# Patient Record
Sex: Female | Born: 1995 | Race: White | Hispanic: No | Marital: Single | Smoking: Never smoker
Health system: Southern US, Community
[De-identification: ages and names within clinical notes are randomized; demographics above are authoritative.]

## PROBLEM LIST (undated history)

## (undated) HISTORY — PX: TONSILLECTOMY: SUR1361

## (undated) HISTORY — PX: ADENOIDECTOMY: SUR15

---
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
## 2016-09-27 ENCOUNTER — Emergency Department (HOSPITAL_COMMUNITY): Payer: Federal, State, Local not specified - PPO

## 2016-09-27 ENCOUNTER — Encounter (HOSPITAL_COMMUNITY): Payer: Self-pay

## 2016-09-27 ENCOUNTER — Emergency Department (HOSPITAL_COMMUNITY)
Admission: EM | Admit: 2016-09-27 | Discharge: 2016-09-27 | Disposition: A | Discharge status: Home / Self Care | Payer: Federal, State, Local not specified - PPO | Attending: Emergency Medicine | Admitting: Emergency Medicine

## 2016-09-27 DIAGNOSIS — Y999 Unspecified external cause status: Secondary | ICD-10-CM | POA: Insufficient documentation

## 2016-09-27 DIAGNOSIS — S6992XA Unspecified injury of left wrist, hand and finger(s), initial encounter: Secondary | ICD-10-CM | POA: Diagnosis present

## 2016-09-27 DIAGNOSIS — Y929 Unspecified place or not applicable: Secondary | ICD-10-CM | POA: Insufficient documentation

## 2016-09-27 DIAGNOSIS — S63502A Unspecified sprain of left wrist, initial encounter: Secondary | ICD-10-CM | POA: Diagnosis not present

## 2016-09-27 DIAGNOSIS — Y939 Activity, unspecified: Secondary | ICD-10-CM | POA: Insufficient documentation

## 2016-09-27 MED ORDER — IBUPROFEN 600 MG PO TABS: 600.0000 mg | ORAL_TABLET | Freq: Four times a day (QID) | ORAL | 0 refills | Status: DC | PRN

## 2016-09-27 MED ORDER — IBUPROFEN 200 MG PO TABS: 400.0000 mg | ORAL_TABLET | ORAL | 0 refills | Status: AC | PRN

## 2016-09-27 MED ORDER — IBUPROFEN 600 MG PO TABS: 600.0000 mg | ORAL_TABLET | Freq: Four times a day (QID) | ORAL | 0 refills | Status: AC | PRN

## 2016-09-27 NOTE — Discharge Instructions (Signed)
Follow up with your Physicain if symptoms persist past one week

## 2016-09-27 NOTE — ED Triage Notes (Signed)
Reports of falling off horse pta. Swelling noted to left wrist.

## 2016-09-27 NOTE — ED Provider Notes (Signed)
[facility]-EMER[facility]ENCY DEPT Provider Note   CSN: [id number] Arrival date & time: 09/27/16  1731     History   Chief Complaint Chief Complaint  Patient presents with  . Wrist Pain    HPI Marissa Esparza is a 21 y.o. female.  The history is provided by the patient. No language interpreter was used.  Wrist Pain  This is a new problem. The current episode started less than 1 hour ago. The problem occurs constantly. The problem has not changed since onset.Pertinent negatives include no headaches. Nothing aggravates the symptoms. Nothing relieves the symptoms. She has tried nothing for the symptoms. The treatment provided no relief.  Pt reports she fell off of a bucking horse and landed injuring left wrist.    Past Medical History:  Diagnosis Date  . Migraine     Patient Active Problem List   Diagnosis Date Noted  . ANKLE INSTABILITY 06/28/2008  . ANKLE SPRAIN, LEFT 06/28/2008    Past Surgical History:  Procedure Laterality Date  . ADENOIDECTOMY    . TONSILLECTOMY      OB History    No data available       Home Medications    Prior to Admission medications   Medication Sig Start Date End Date Taking? Authorizing Provider  ibuprofen (ADVIL,MOTRIN) 200 MG tablet Take 2 tablets (400 mg total) by mouth as needed. For pain 09/27/16   Elson Areas, PA-C  ibuprofen (ADVIL,MOTRIN) 600 MG tablet Take 1 tablet (600 mg total) by mouth every 6 (six) hours as needed. 09/27/16   Elson Areas, PA-C  Norgestimate-Ethinyl Estradiol Triphasic (TRI-SPRINTEC) 0.18/0.215/0.25 MG-35 MCG tablet Take 1 tablet by mouth at bedtime.      [provider]  sulfamethoxazole-trimethoprim (BACTRIM,SEPTRA) 400-80 MG per tablet Take 1 tablet by mouth 2 (two) times daily.      [provider]    Family History No family history on file.  Social History Social History  Substance Use Topics  . Smoking status: Never Smoker  . Smokeless tobacco: Never Used  . Alcohol use No      Allergies   Patient has no known allergies.   Review of Systems Review of Systems  Neurological: Negative for headaches.  All other systems reviewed and are negative.    Physical Exam Updated Vital Signs BP (!) 125/94 (BP Location: Right Arm)   Pulse 99   Temp 98.2 F (36.8 C) (Oral)   Resp 18   Ht  (1.549 m)   Wt 47.7 kg (105 lb 3.2 oz)   LMP 09/22/2016   SpO2 100%   BMI 19.88 kg/m   Physical Exam  Constitutional: She appears well-developed and well-nourished.  HENT:  Head: Normocephalic.  Neck: Normal range of motion.  Musculoskeletal: She exhibits tenderness. She exhibits no deformity.  Tender left wrist,  Slight swelling,  Pain with range of motion,  nv and ns intact   Neurological: She is alert.  Skin: Skin is warm.  Psychiatric: She has a normal mood and affect.  Nursing note and vitals reviewed.    ED Treatments / Results  Labs (all labs ordered are listed, but only abnormal results are displayed) Labs Reviewed - No data to display  EKG  EKG Interpretation None       Radiology Dg Wrist Complete Left  Result Date: 09/27/2016 CLINICAL DATA:  Per ED notes: Reports of falling off horse pta. Swelling noted to left wrist.Pt stated she jumped off horse bc it started bucking; co  lt wrist pain with movement. Pt states NCOP. EXAM: LEFT WRIST - COMPLETE 3+ VIEW COMPARISON:  None. FINDINGS: There is no evidence of fracture or dislocation. There is no evidence of arthropathy or other focal bone abnormality. Soft tissues are unremarkable. IMPRESSION: Negative. Electronically Signed   By: Amie Portland M.D.   On: 09/27/2016 18:15    Procedures Procedures (including critical care time)  Medications Ordered in ED Medications - No data to display   Initial Impression / Assessment and Plan / ED Course  I have reviewed the triage vital signs and the nursing notes.  Pertinent labs & imaging results that were available during my care of the patient  were reviewed by me and considered in my medical decision making (see chart for details).     Xray negative,  Pt placed in a wrist splint.  Pt advised to follow up with primary care or urgent care if pain persist past one week.  Final Clinical Impressions(s) / ED Diagnoses   Final diagnoses:  Sprain of left wrist, initial encounter    New Prescriptions Current Discharge Medication List    An After Visit Summary was printed and given to the patient. No outpatient prescriptions have been marked as taking for the 09/27/16 encounter [facility] Encounter).   Meds ordered this encounter  Medications  . DISCONTD: ibuprofen (ADVIL,MOTRIN) 600 MG tablet    Sig: Take 1 tablet (600 mg total) by mouth every 6 (six) hours as needed.    Dispense:  30 tablet    Refill:  0    Order Specific Question:   Supervising Provider    Answer:   MILLER, BRIAN [[id number]]  . ibuprofen (ADVIL,MOTRIN) 200 MG tablet    Sig: Take 2 tablets (400 mg total) by mouth as needed. For pain    Dispense:  30 tablet    Refill:  0    Order Specific Question:   Supervising Provider    Answer:   MILLER, BRIAN [[id number]]  . ibuprofen (ADVIL,MOTRIN) 600 MG tablet    Sig: Take 1 tablet (600 mg total) by mouth every 6 (six) hours as needed.    Dispense:  30 tablet    Refill:  0    Order Specific Question:   Supervising Provider    Answer:   Eber Hong [[id number]]     Elson Areas, PA-C 09/27/16 1842    Vanetta Mulders, MD 09/30/16 [phone]

## 2018-01-28 ENCOUNTER — Emergency Department (HOSPITAL_COMMUNITY): Payer: Federal, State, Local not specified - PPO

## 2018-01-28 ENCOUNTER — Emergency Department (HOSPITAL_COMMUNITY)
Admission: EM | Admit: 2018-01-28 | Discharge: 2018-01-28 | Disposition: A | Discharge status: Home / Self Care | Payer: Federal, State, Local not specified - PPO | Attending: Emergency Medicine | Admitting: Emergency Medicine

## 2018-01-28 DIAGNOSIS — N200 Calculus of kidney: Secondary | ICD-10-CM | POA: Diagnosis not present

## 2018-01-28 DIAGNOSIS — R110 Nausea: Secondary | ICD-10-CM | POA: Insufficient documentation

## 2018-01-28 DIAGNOSIS — R109 Unspecified abdominal pain: Secondary | ICD-10-CM | POA: Diagnosis present

## 2018-01-28 DIAGNOSIS — Z79899 Other long term (current) drug therapy: Secondary | ICD-10-CM | POA: Diagnosis not present

## 2018-01-28 LAB — COMPREHENSIVE METABOLIC PANEL
ALT: 14 U/L (ref 0–44)
AST: 36 U/L (ref 15–41)
Albumin: 4.5 g/dL (ref 3.5–5.0)
Alkaline Phosphatase: 43 U/L (ref 38–126)
Anion gap: 13 (ref 5–15)
BILIRUBIN TOTAL: 1.2 mg/dL (ref 0.3–1.2)
BUN: 5 mg/dL — ABNORMAL LOW (ref 6–20)
CO2: 19 mmol/L — ABNORMAL LOW (ref 22–32)
Calcium: 9.5 mg/dL (ref 8.9–10.3)
Chloride: 102 mmol/L (ref 98–111)
Creatinine, Ser: 0.8 mg/dL (ref 0.44–1.00)
GFR calc Af Amer: >60 mL/min (ref >60)
GFR calc non Af Amer: >60 mL/min (ref >60)
GLUCOSE: 111 mg/dL — AB (ref 70–99)
Potassium: 4.5 mmol/L (ref 3.5–5.1)
Sodium: 134 mmol/L — ABNORMAL LOW (ref 135–145)
Total Protein: 7.2 g/dL (ref 6.5–8.1)

## 2018-01-28 LAB — URINALYSIS, ROUTINE W REFLEX MICROSCOPIC
Bilirubin Urine: NEGATIVE
Glucose, UA: NEGATIVE mg/dL
Ketones, ur: 80 mg/dL — AB
Leukocytes, UA: NEGATIVE
Nitrite: NEGATIVE
Protein, ur: NEGATIVE mg/dL
Specific Gravity, Urine: 1.021 (ref 1.005–1.030)
pH: 5 (ref 5.0–8.0)

## 2018-01-28 LAB — CBC WITH DIFFERENTIAL/PLATELET
Abs Immature Granulocytes: 0.06 10*3/uL (ref 0.00–0.07)
Basophils Absolute: 0 10*3/uL (ref 0.0–0.1)
Basophils Relative: 0 %
Eosinophils Absolute: 0 10*3/uL (ref 0.0–0.5)
Eosinophils Relative: 0 %
HCT: 39.5 % (ref 36.0–46.0)
Hemoglobin: 12.9 g/dL (ref 12.0–15.0)
Immature Granulocytes: 0 %
Lymphocytes Relative: 7 %
Lymphs Abs: 1 10*3/uL (ref 0.7–4.0)
MCH: 31.2 pg (ref 26.0–34.0)
MCHC: 32.7 g/dL (ref 30.0–36.0)
MCV: 95.6 fL (ref 80.0–100.0)
MONOS PCT: 4 %
Monocytes Absolute: 0.6 10*3/uL (ref 0.1–1.0)
Neutro Abs: 12.8 10*3/uL — ABNORMAL HIGH (ref 1.7–7.7)
Neutrophils Relative %: 89 %
Platelets: 297 10*3/uL (ref 150–400)
RBC: 4.13 MIL/uL (ref 3.87–5.11)
RDW: 13 % (ref 11.5–15.5)
WBC: 14.4 10*3/uL — ABNORMAL HIGH (ref 4.0–10.5)
nRBC: 0 % (ref 0.0–0.2)

## 2018-01-28 LAB — I-STAT BETA HCG BLOOD, ED (MC, WL, AP ONLY): I-stat hCG, quantitative: <5 m[IU]/mL (ref <5)

## 2018-01-28 MED ORDER — HYDROCODONE-ACETAMINOPHEN 5-325 MG PO TABS: 1.0000 | ORAL_TABLET | Freq: Four times a day (QID) | ORAL | 0 refills | Status: AC | PRN

## 2018-01-28 MED ORDER — ONDANSETRON HCL 4 MG/2ML IJ SOLN
4.0000 mg | Freq: Once | INTRAMUSCULAR | Status: AC
  Administered 2018-01-28: 4 mg via INTRAVENOUS
  Filled 2018-01-28: qty 2

## 2018-01-28 MED ORDER — HYDROCODONE-ACETAMINOPHEN 5-325 MG PO TABS
1.0000 | ORAL_TABLET | Freq: Once | ORAL | Status: AC
  Administered 2018-01-28: 1 via ORAL
  Filled 2018-01-28: qty 1

## 2018-01-28 MED ORDER — SODIUM CHLORIDE 0.9 % IV BOLUS
500.0000 mL | Freq: Once | INTRAVENOUS | Status: AC
  Administered 2018-01-28: 500 mL via INTRAVENOUS

## 2018-01-28 MED ORDER — ONDANSETRON HCL 4 MG PO TABS: 4.0000 mg | ORAL_TABLET | Freq: Three times a day (TID) | ORAL | 0 refills | Status: AC | PRN

## 2018-01-28 MED ORDER — HYDROMORPHONE HCL 1 MG/ML IJ SOLN
0.5000 mg | Freq: Once | INTRAMUSCULAR | Status: AC
  Administered 2018-01-28: 0.5 mg via INTRAVENOUS
  Filled 2018-01-28: qty 1

## 2018-01-28 MED ORDER — TAMSULOSIN HCL 0.4 MG PO CAPS: 0.4000 mg | ORAL_CAPSULE | Freq: Every day | ORAL | 0 refills | Status: AC

## 2018-01-28 MED ORDER — MORPHINE SULFATE (PF) 4 MG/ML IV SOLN
4.0000 mg | Freq: Once | INTRAVENOUS | Status: AC
  Administered 2018-01-28: 4 mg via INTRAVENOUS
  Filled 2018-01-28: qty 1

## 2018-01-28 NOTE — ED Provider Notes (Signed)
[facility]   CSN: [id number] Arrival date & time: 01/28/18  1559     History   Chief Complaint Chief Complaint  Patient presents with  . Abdominal Pain    HPI Marissa Esparza is a 23 y.o. female for evaluation left-sided flank pain.  Patient states that the past 3 days, she has been having a discomfort in her bladder.  Today acutely at 2:00, she developed left-sided abdominal/flank pain.  It is sharp and severe and constant.  Movement and palpation makes it worse, nothing makes it better.  She has not taken anything for pain including Tylenol or ibuprofen.  She ports associated nausea, no vomiting.  She has fevers, chills, chest pain, shortness of breath, dysuria, urinary frequency, hematuria, or normal bowel movements.  She denies a history of kidney stones.  Patient states she has no medical problems, takes no medications daily.  She denies vaginal discharge or vaginal bleeding.  HPI  Past Medical History:  Diagnosis Date  . Migraine     Patient Active Problem List   Diagnosis Date Noted  . ANKLE INSTABILITY 06/28/2008  . ANKLE SPRAIN, LEFT 06/28/2008    Past Surgical History:  Procedure Laterality Date  . ADENOIDECTOMY    . TONSILLECTOMY       OB History   No obstetric history on file.      Home Medications    Prior to Admission medications   Medication Sig Start Date End Date Taking? Authorizing Provider  HYDROcodone-acetaminophen (NORCO/VICODIN) 5-325 MG tablet Take 1 tablet by mouth every 6 (six) hours as needed for severe pain. 01/28/18   Sopheap Boehle, PA-C  ibuprofen (ADVIL,MOTRIN) 200 MG tablet Take 2 tablets (400 mg total) by mouth as needed. For pain 09/27/16   Elson AreasSofia, Leslie K, PA-C  ibuprofen (ADVIL,MOTRIN) 600 MG tablet Take 1 tablet (600 mg total) by mouth every 6 (six) hours as needed. 09/27/16   Elson AreasSofia, Leslie K, PA-C  Norgestimate-Ethinyl Estradiol Triphasic (TRI-SPRINTEC) 0.18/0.215/0.25  MG-35 MCG tablet Take 1 tablet by mouth at bedtime.      [provider]  ondansetron (ZOFRAN) 4 MG tablet Take 1 tablet (4 mg total) by mouth every 8 (eight) hours as needed for nausea or vomiting. 01/28/18   Huong Luthi, PA-C  sulfamethoxazole-trimethoprim (BACTRIM,SEPTRA) 400-80 MG per tablet Take 1 tablet by mouth 2 (two) times daily.      [provider]  tamsulosin (FLOMAX) 0.4 MG CAPS capsule Take 1 capsule (0.4 mg total) by mouth daily. 01/28/18   Sina Lucchesi, PA-C    Family History No family history on file.  Social History Social History   Tobacco Use  . Smoking status: Never Smoker  . Smokeless tobacco: Never Used  Substance Use Topics  . Alcohol use: No  . Drug use: No     Allergies   Patient has no known allergies.   Review of Systems Review of Systems  Gastrointestinal: Positive for abdominal pain and nausea.  Genitourinary: Positive for flank pain.  All other systems reviewed and are negative.    Physical Exam Updated Vital Signs BP 111/71   Pulse 77   Temp 98.2 F (36.8 C) (Oral)   Resp 20   SpO2 100%   Physical Exam Vitals signs and nursing note reviewed.  Constitutional:      General: She is not in acute distress.    Appearance: She is well-developed.     Comments: Patient appears uncomfortable, but in no acute distress  HENT:     Head: Normocephalic and atraumatic.  Eyes:     Conjunctiva/sclera: Conjunctivae normal.     Pupils: Pupils are equal, round, and reactive to light.  Neck:     Musculoskeletal: Normal range of motion and neck supple.  Cardiovascular:     Rate and Rhythm: Normal rate and regular rhythm.     Pulses: Normal pulses.  Pulmonary:     Effort: Pulmonary effort is normal. No respiratory distress.     Breath sounds: Normal breath sounds. No wheezing.  Abdominal:     General: There is no distension.     Palpations: Abdomen is soft. There is no mass.     Tenderness: There is abdominal  tenderness. There is left CVA tenderness. There is no right CVA tenderness, guarding or rebound.     Comments: Tenderness to palpation of left upper and lower abdomen.  Tenderness palpation of left flank.  No tenderness palpation on the right side of the abdomen.  Negative rebound.  No rigidity, guarding, distention.  Musculoskeletal: Normal range of motion.  Skin:    General: Skin is warm and dry.     Capillary Refill: Capillary refill takes less than 2 seconds.  Neurological:     Mental Status: She is alert and oriented to person, place, and time.      ED Treatments / Results  Labs (all labs ordered are listed, but only abnormal results are displayed) Labs Reviewed  COMPREHENSIVE METABOLIC PANEL - Abnormal; Notable for the following components:      Result Value   Sodium 134 (*)    CO2 19 (*)    Glucose, Bld 111 (*)    BUN 5 (*)    All other components within normal limits  URINALYSIS, ROUTINE W REFLEX MICROSCOPIC - Abnormal; Notable for the following components:   APPearance HAZY (*)    Hgb urine dipstick MODERATE (*)    Ketones, ur 80 (*)    Bacteria, UA FEW (*)    All other components within normal limits  CBC WITH DIFFERENTIAL/PLATELET - Abnormal; Notable for the following components:   WBC 14.4 (*)    Neutro Abs 12.8 (*)    All other components within normal limits  URINE CULTURE  CBC WITH DIFFERENTIAL/PLATELET  I-STAT BETA HCG BLOOD, ED ([facility], [facility], [facility] ONLY)    EKG None  Radiology Ct Renal Stone Study  Result Date: 01/28/2018 CLINICAL DATA:  LEFT flank and lower back pain for 2 days, suspected stone disease EXAM: CT ABDOMEN AND PELVIS WITHOUT CONTRAST TECHNIQUE: Multidetector CT imaging of the abdomen and pelvis was performed following the standard protocol without IV contrast. Sagittal and coronal MPR images reconstructed from axial data set. Oral contrast was not administered for this indication. COMPARISON:  None FINDINGS: Lower chest: Lung bases clear  Hepatobiliary: Gallbladder and liver normal appearance Pancreas: Normal appearance Spleen: Normal appearance. Adrenals/Urinary Tract: Adrenal glands: Normal appearance. RIGHT kidney normal appearance. Mild LEFT hydronephrosis and hydroureter secondary to a 2 mm distal LEFT ureteral calculus image 64. Bladder decompressed. No renal masses. Stomach/Bowel: Normal appendix. Stomach and bowel loops grossly normal appearance for technique. Vascular/Lymphatic: Aorta normal caliber. Few pelvic phleboliths. No adenopathy. Reproductive: Diaphragm within vagina. Uterus and adnexa unremarkable. Other: Small amount of cul-de-sac free fluid, potentially physiologic in a young female. No free air. No hernia. Musculoskeletal: Unremarkable IMPRESSION: Mild LEFT hydronephrosis and hydroureter secondary to a 2 mm distal LEFT ureteral calculus. Electronically Signed   By: Ulyses SouthwardMark  Boles M.D.   On:  01/28/2018 17:41    Procedures Procedures (including critical care time)  Medications Ordered in ED Medications  sodium chloride 0.9 % bolus 500 mL (0 mLs Intravenous Stopped 01/28/18 1723)  ondansetron (ZOFRAN) injection 4 mg (4 mg Intravenous Given 01/28/18 1632)  morphine 4 MG/ML injection 4 mg (4 mg Intravenous Given 01/28/18 1632)  HYDROmorphone (DILAUDID) injection 0.5 mg (0.5 mg Intravenous Given 01/28/18 1717)  HYDROcodone-acetaminophen (NORCO/VICODIN) 5-325 MG per tablet 1 tablet (1 tablet Oral Given 01/28/18 1916)     Initial Impression / Assessment and Plan / ED Course  I have reviewed the triage vital signs and the nursing notes.  Pertinent labs & imaging results that were available during my care of the patient were reviewed by me and considered in my medical decision making (see chart for details).     Pt presenting for evaluation of left-sided abdominal pain.  Physical exam is patient is uncomfortable, but in no acute distress.  She is afebrile not tachycardic.  Appears nontoxic.  Pain is reproducible with  palpation of the flank, as well as the abdomen.  Likely kidney stone.  Also consider GI infection, pyelonephritis, or GU causes such as torsion versus TOA.  However, considering CVA tenderness, lower suspicion for GU causes at this time.  Will give fluids, Zofran, pain control, order urine, and CT renal.  On reassessment, patient reports mild improvement with morphine, but still having a lot of pain.  Will give small dose of Dilaudid.  Labs reassuring, creatinine stable.  Patient with leukocytosis, but no obvious infection.  No leuks or white cells, and only a few bacteria in the UA.  No urinary symptoms.  Doubt UTI, will send urine for culture.  CT renal shows left-sided kidney stone with mild hydro.  No obvious signs or concerns for torsion or TOA.  On reassessment, patient reports pain is much improved with Dilaudid.  Discussed symptomatic control at home, and follow-up with urology as needed.  PMP checked, patient without concerning narcotic use.  Strict return precautions given.  At this time, patient appears safe for discharge.  Patient states she understands and agrees to plan.   Final Clinical Impressions(s) / ED Diagnoses   Final diagnoses:  Kidney stone on left side    ED Discharge Orders         Ordered    ondansetron (ZOFRAN) 4 MG tablet  Every 8 hours PRN     01/28/18 1906    HYDROcodone-acetaminophen (NORCO/VICODIN) 5-325 MG tablet  Every 6 hours PRN     01/28/18 1906    tamsulosin (FLOMAX) 0.4 MG CAPS capsule  Daily     01/28/18 1906           Alveria Apley, Cordelia Poche 01/28/18 2009    Jacalyn Lefevre, MD 01/30/18 2313

## 2018-01-28 NOTE — Discharge Instructions (Addendum)
Make sure you are staying well-hydrated water.  This is very important.  Your urine should be clear to pale yellow. Use Tylenol or ibuprofen as needed for mild to moderate pain.  Use Norco as needed for severe breakthrough pain.  Have caution, this is a narcotic pain medicine.  Do not drive or operate machinery while taking this medicine. Flomax daily. Use Zofran as needed for nausea or vomiting. Use a urine strainer to try and catch the kidney stone. If your pain is not improving within the next week, follow-up with urology. Return to the emergency room if you develop high fevers, persistent vomiting, severe worsening pain, inability to urinate, or any new, worsening, or concerning symptoms.

## 2018-01-28 NOTE — ED Triage Notes (Signed)
[facility] evaluation of LLQ pain and nausea, hematuria for 1 week. Patient appears uncomfortable in triage. Denies fevers.

## 2018-01-29 LAB — URINE CULTURE: Culture: 20000 — AB

## 2018-02-02 ENCOUNTER — Encounter: Payer: Self-pay | Admitting: Emergency Medicine

## 2018-02-02 ENCOUNTER — Emergency Department (HOSPITAL_COMMUNITY): Payer: Federal, State, Local not specified - PPO

## 2018-02-02 ENCOUNTER — Emergency Department (HOSPITAL_COMMUNITY)
Admission: EM | Admit: 2018-02-02 | Discharge: 2018-02-02 | Disposition: A | Discharge status: Home / Self Care | Payer: Federal, State, Local not specified - PPO | Attending: Emergency Medicine | Admitting: Emergency Medicine

## 2018-02-02 DIAGNOSIS — R109 Unspecified abdominal pain: Secondary | ICD-10-CM | POA: Diagnosis present

## 2018-02-02 DIAGNOSIS — N200 Calculus of kidney: Secondary | ICD-10-CM | POA: Diagnosis not present

## 2018-02-02 DIAGNOSIS — B9789 Other viral agents as the cause of diseases classified elsewhere: Secondary | ICD-10-CM

## 2018-02-02 DIAGNOSIS — Z79899 Other long term (current) drug therapy: Secondary | ICD-10-CM | POA: Diagnosis not present

## 2018-02-02 DIAGNOSIS — J069 Acute upper respiratory infection, unspecified: Secondary | ICD-10-CM

## 2018-02-02 LAB — CBC WITH DIFFERENTIAL/PLATELET
Abs Immature Granulocytes: 0 10*3/uL (ref 0.00–0.07)
Basophils Absolute: 0 10*3/uL (ref 0.0–0.1)
Basophils Relative: 0 %
Eosinophils Absolute: 0 10*3/uL (ref 0.0–0.5)
Eosinophils Relative: 0 %
HCT: 38.1 % (ref 36.0–46.0)
Hemoglobin: 12.3 g/dL (ref 12.0–15.0)
Lymphocytes Relative: 3 %
Lymphs Abs: 0.2 10*3/uL — ABNORMAL LOW (ref 0.7–4.0)
MCH: 30.8 pg (ref 26.0–34.0)
MCHC: 32.3 g/dL (ref 30.0–36.0)
MCV: 95.3 fL (ref 80.0–100.0)
Monocytes Absolute: 0.3 10*3/uL (ref 0.1–1.0)
Monocytes Relative: 4 %
Neutro Abs: 6.5 10*3/uL (ref 1.7–7.7)
Neutrophils Relative %: 93 %
Platelets: 186 10*3/uL (ref 150–400)
RBC: 4 MIL/uL (ref 3.87–5.11)
RDW: 12.6 % (ref 11.5–15.5)
WBC: 7 10*3/uL (ref 4.0–10.5)
nRBC: 0 % (ref 0.0–0.2)
nRBC: 0 /100 WBC

## 2018-02-02 LAB — URINALYSIS, ROUTINE W REFLEX MICROSCOPIC
Bilirubin Urine: NEGATIVE
Glucose, UA: NEGATIVE mg/dL
Hgb urine dipstick: NEGATIVE
Ketones, ur: 80 mg/dL — AB
Leukocytes, UA: NEGATIVE
Nitrite: NEGATIVE
Protein, ur: NEGATIVE mg/dL
Specific Gravity, Urine: 1.021 (ref 1.005–1.030)
pH: 5 (ref 5.0–8.0)

## 2018-02-02 LAB — BASIC METABOLIC PANEL
Anion gap: 13 (ref 5–15)
BUN: 8 mg/dL (ref 6–20)
CO2: 20 mmol/L — ABNORMAL LOW (ref 22–32)
Calcium: 8.6 mg/dL — ABNORMAL LOW (ref 8.9–10.3)
Chloride: 99 mmol/L (ref 98–111)
Creatinine, Ser: 0.77 mg/dL (ref 0.44–1.00)
GFR calc Af Amer: >60 mL/min (ref >60)
GFR calc non Af Amer: >60 mL/min (ref >60)
Glucose, Bld: 84 mg/dL (ref 70–99)
Potassium: 3.4 mmol/L — ABNORMAL LOW (ref 3.5–5.1)
Sodium: 132 mmol/L — ABNORMAL LOW (ref 135–145)

## 2018-02-02 MED ORDER — OXYCODONE-ACETAMINOPHEN 5-325 MG PO TABS: 1.0000 | ORAL_TABLET | Freq: Four times a day (QID) | ORAL | 0 refills | Status: AC | PRN

## 2018-02-02 MED ORDER — HYDROMORPHONE HCL 1 MG/ML IJ SOLN
0.5000 mg | Freq: Once | INTRAMUSCULAR | Status: AC
  Administered 2018-02-02: 0.5 mg via INTRAVENOUS
  Filled 2018-02-02: qty 1

## 2018-02-02 MED ORDER — KETOROLAC TROMETHAMINE 30 MG/ML IJ SOLN
30.0000 mg | Freq: Once | INTRAMUSCULAR | Status: AC
  Administered 2018-02-02: 30 mg via INTRAVENOUS
  Filled 2018-02-02: qty 1

## 2018-02-02 MED ORDER — KETOROLAC TROMETHAMINE 30 MG/ML IJ SOLN: 30.0000 mg | Freq: Once | INTRAMUSCULAR | Status: DC

## 2018-02-02 NOTE — Discharge Instructions (Signed)
Please read attached information. If you experience any new or worsening signs or symptoms please return to the emergency room for evaluation. Please follow-up with your primary care provider or specialist as discussed. Please use medication prescribed only as directed and discontinue taking if you have any concerning signs or symptoms.   °

## 2018-02-02 NOTE — ED Provider Notes (Signed)
[facility] Provider [facility]   CSN: [id number] Arrival date & time: 02/02/18  1349     History   Chief Complaint No chief complaint on file.   HPI Marissa Esparza is a 23 y.o. female.  HPI    23 year old female presents today with complaints of kidney stones.  Patient was originally seen January 28, 2022 left-sided abdominal flank pain.  He was noted to have left-sided kidney stone with mild hydronephrosis.  She had improvement in symptoms while here in the emergency room and was discharged home.  She notes that the pain medicine has been improving her symptoms at home although she took her last dose this morning.  She notes some waves of pain last night and today, also notes some subjective fever and chills.  He notes she has been straining her urine with no stone produced, denies any urinary symptoms.  She also reports minor upper respiratory congestion, denies any productive cough or shortness of breath.  No significant sore throat.    Past Medical History:  Diagnosis Date  . Migraine     Patient Active Problem List   Diagnosis Date Noted  . ANKLE INSTABILITY 06/28/2008  . ANKLE SPRAIN, LEFT 06/28/2008    Past Surgical History:  Procedure Laterality Date  . ADENOIDECTOMY    . TONSILLECTOMY       OB History   No obstetric history on file.      Home Medications    Prior to Admission medications   Medication Sig Start Date End Date Taking? Authorizing Provider  HYDROcodone-acetaminophen (NORCO/VICODIN) 5-325 MG tablet Take 1 tablet by mouth every 6 (six) hours as needed for severe pain. 01/28/18   Caccavale, Sophia, PA-C  ibuprofen (ADVIL,MOTRIN) 200 MG tablet Take 2 tablets (400 mg total) by mouth as needed. For pain 09/27/16   Elson AreasSofia, Leslie K, PA-C  ibuprofen (ADVIL,MOTRIN) 600 MG tablet Take 1 tablet (600 mg total) by mouth every 6 (six) hours as needed. 09/27/16   Elson AreasSofia, Leslie K, PA-C  Norgestimate-Ethinyl Estradiol  Triphasic (TRI-SPRINTEC) 0.18/0.215/0.25 MG-35 MCG tablet Take 1 tablet by mouth at bedtime.      [provider]  ondansetron (ZOFRAN) 4 MG tablet Take 1 tablet (4 mg total) by mouth every 8 (eight) hours as needed for nausea or vomiting. 01/28/18   Caccavale, Sophia, PA-C  oxyCODONE-acetaminophen (PERCOCET/ROXICET) 5-325 MG tablet Take 1 tablet by mouth every 6 (six) hours as needed. 02/02/18   Aneisa Karren, Tinnie GensJeffrey, PA-C  sulfamethoxazole-trimethoprim (BACTRIM,SEPTRA) 400-80 MG per tablet Take 1 tablet by mouth 2 (two) times daily.      [provider]  tamsulosin (FLOMAX) 0.4 MG CAPS capsule Take 1 capsule (0.4 mg total) by mouth daily. 01/28/18   Caccavale, Sophia, PA-C    Family History No family history on file.  Social History Social History   Tobacco Use  . Smoking status: Never Smoker  . Smokeless tobacco: Never Used  Substance Use Topics  . Alcohol use: No  . Drug use: No     Allergies   Patient has no known allergies.   Review of Systems Review of Systems  All other systems reviewed and are negative.   Physical Exam Updated Vital Signs BP 109/67 (BP Location: Right Arm)   Pulse 64   Temp 98.5 F (36.9 C) (Oral)   Resp 18   Ht 5\' 1"  (1.549 m)   Wt 49.4 kg   SpO2 97%   BMI 20.60 kg/m   Physical Exam Vitals  signs and nursing note reviewed.  Constitutional:      Appearance: She is well-developed.  HENT:     Head: Normocephalic and atraumatic.  Eyes:     General: No scleral icterus.       Right eye: No discharge.        Left eye: No discharge.     Conjunctiva/sclera: Conjunctivae normal.     Pupils: Pupils are equal, round, and reactive to light.  Neck:     Musculoskeletal: Normal range of motion.     Vascular: No JVD.     Trachea: No tracheal deviation.  Pulmonary:     Effort: Pulmonary effort is normal.     Breath sounds: No stridor.  Abdominal:     Comments: Minimal tenderness palpation of left upper quadrant-meter abdomen soft  nontender-no CVA tenderness  Neurological:     Mental Status: She is alert and oriented to person, place, and time.     Coordination: Coordination normal.  Psychiatric:        Behavior: Behavior normal.        Thought Content: Thought content normal.        Judgment: Judgment normal.      ED Treatments / Results  Labs (all labs ordered are listed, but only abnormal results are displayed) Labs Reviewed  CBC WITH DIFFERENTIAL/PLATELET - Abnormal; Notable for the following components:      Result Value   Lymphs Abs 0.2 (*)    All other components within normal limits  URINALYSIS, ROUTINE W REFLEX MICROSCOPIC - Abnormal; Notable for the following components:   Ketones, ur 80 (*)    All other components within normal limits  BASIC METABOLIC PANEL - Abnormal; Notable for the following components:   Sodium 132 (*)    Potassium 3.4 (*)    CO2 20 (*)    Calcium 8.6 (*)    All other components within normal limits    EKG None  Radiology No results found.  Procedures Procedures (including critical care time)  Medications Ordered in ED Medications  ketorolac (TORADOL) 30 MG/ML injection 30 mg (30 mg Intravenous Given 02/02/18 1447)  HYDROmorphone (DILAUDID) injection 0.5 mg (0.5 mg Intravenous Given 02/02/18 1550)     Initial Impression / Assessment and Plan / ED Course  I have reviewed the triage vital signs and the nursing notes.  Pertinent labs & imaging results that were available during my care of the patient were reviewed by me and considered in my medical decision making (see chart for details).      Assessment/Plan: 23 year old female presents today with complaints of flank pain.  Patient has a 2 mm stone as evidenced by previous CT scan.  Patient reports some subjective fever and chills although none presently.  In the recent history of stone I do find it reasonable to obtain urine sample to assure no infection within the urinary system at this time.  Patient was  given pain medication reassessed.  I anticipate that she will be discharged home if she has no signs of infectious etiology with outpatient urology follow-up.   Final Clinical Impressions(s) / ED Diagnoses   Final diagnoses:  Kidney stones    ED Discharge Orders         Ordered    oxyCODONE-acetaminophen (PERCOCET/ROXICET) 5-325 MG tablet  Every 6 hours PRN     02/02/18 1600           Eyvonne Mechanic, PA-C 02/02/18 1606    Tilden Fossa, MD 02/02/18 1907

## 2018-02-02 NOTE — ED Notes (Signed)
0.5mg  of dilaudid wasted in sharps with Maralyn Sago, RN.

## 2018-02-02 NOTE — ED Triage Notes (Signed)
Pt dx with kidney stones last week, last night had onset of profuse sweating, n/v. Pt was told to return to the ER if she had these symptoms.

## 2018-02-02 NOTE — ED Notes (Signed)
Patient transported to X-ray 

## 2018-02-02 NOTE — ED Notes (Signed)
Pt given discharge instructions, prescription and follow up information. Pt verbalized understanding. Pt given the opportunity to ask questions. Pt discharged from the ED.

## 2018-10-28 ENCOUNTER — Other Ambulatory Visit: Payer: Self-pay

## 2018-10-28 DIAGNOSIS — Z20822 Contact with and (suspected) exposure to covid-19: Secondary | ICD-10-CM

## 2018-10-30 LAB — NOVEL CORONAVIRUS, NAA: SARS-CoV-2, NAA: NOT DETECTED

## 2019-01-03 ENCOUNTER — Ambulatory Visit: Payer: Federal, State, Local not specified - PPO | Attending: Internal Medicine

## 2019-01-03 ENCOUNTER — Other Ambulatory Visit: Payer: Self-pay

## 2019-01-03 DIAGNOSIS — Z20822 Contact with and (suspected) exposure to covid-19: Secondary | ICD-10-CM

## 2019-01-04 LAB — NOVEL CORONAVIRUS, NAA: SARS-CoV-2, NAA: NOT DETECTED

## 2019-09-13 IMAGING — CT CT RENAL STONE PROTOCOL
2 of 4 series · 17 of 46 positions shown, 19 images · non-contrast
Comparison: None

CLINICAL DATA: LEFT flank and lower back pain for 2 days, suspected
stone disease

EXAM:
CT ABDOMEN AND PELVIS WITHOUT CONTRAST
TECHNIQUE: Multidetector CT imaging of the abdomen and pelvis was performed
following the standard protocol without IV contrast. Sagittal and
coronal MPR images reconstructed from axial data set. Oral contrast
was not administered for this indication.

[Series 3: stone study 5.0 i30f 2 · axial · 0.60mm/px · z∈[-407,-37]mm · 14 of 82 slices shown, 16 images]
[im 4/82  soft-tissue]
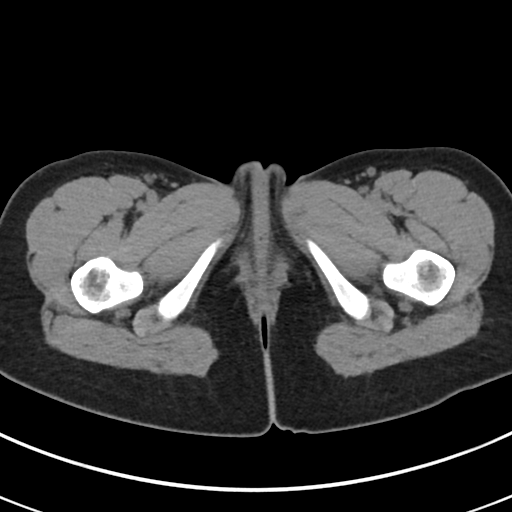
[im 4/82  bone]
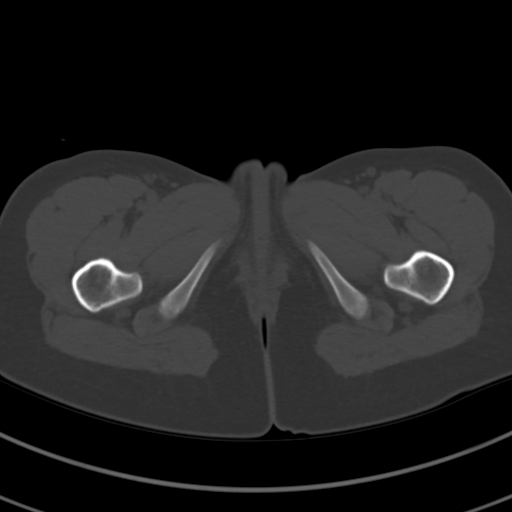
[im 10/82  soft-tissue]
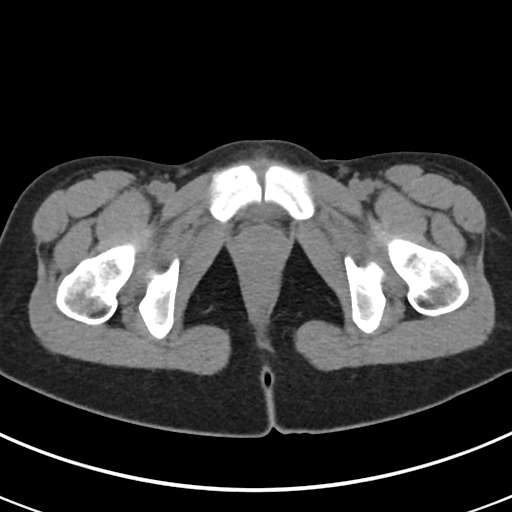
[im 16/82  soft-tissue]
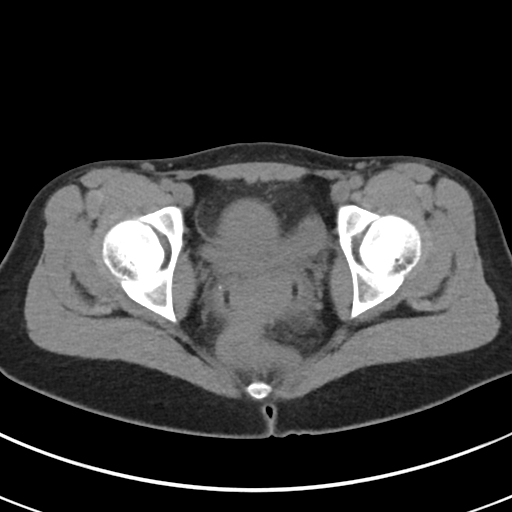
[im 22/82  soft-tissue]
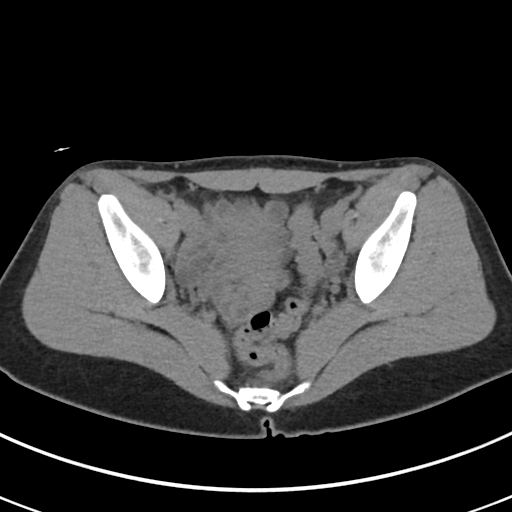
[im 29/82  soft-tissue]
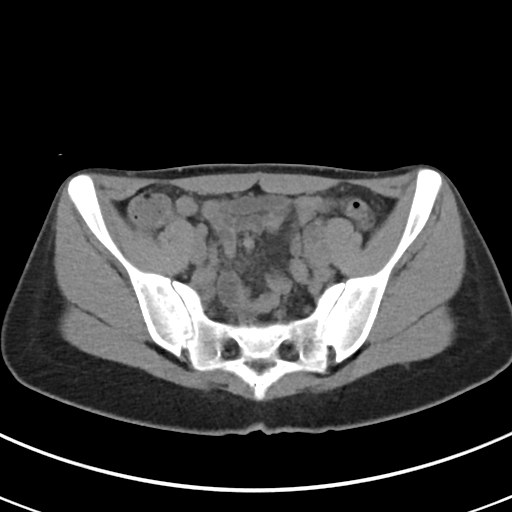
[im 32/82  soft-tissue]
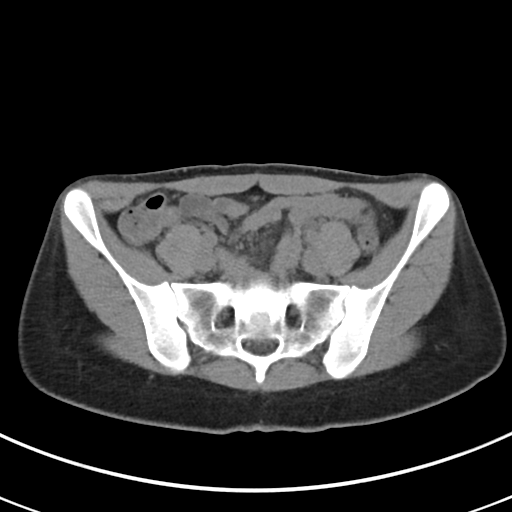
[im 38/82  soft-tissue]
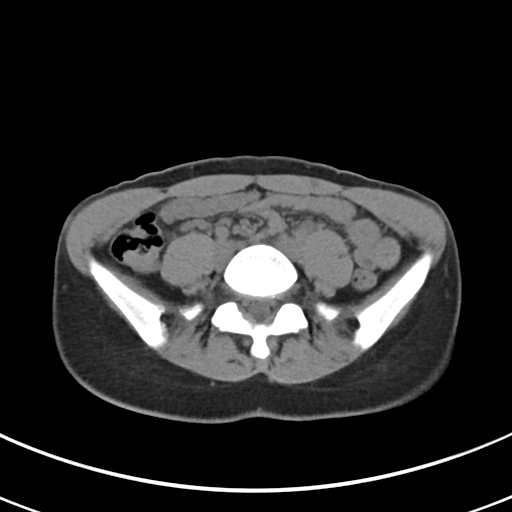
[im 44/82  soft-tissue]
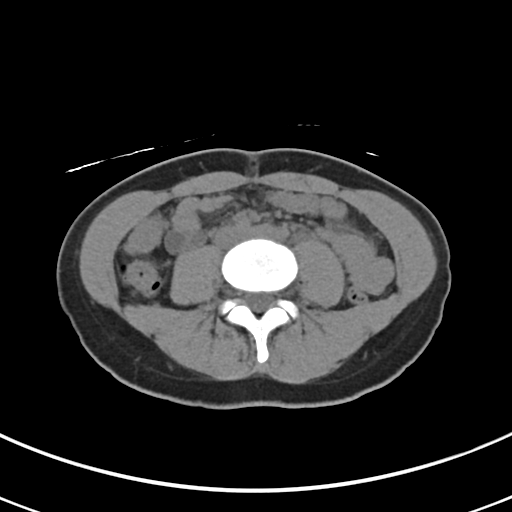
[im 50/82  soft-tissue]
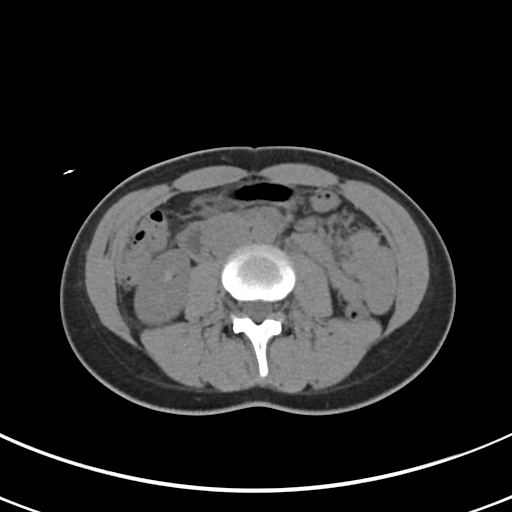
[im 50/82  bone]
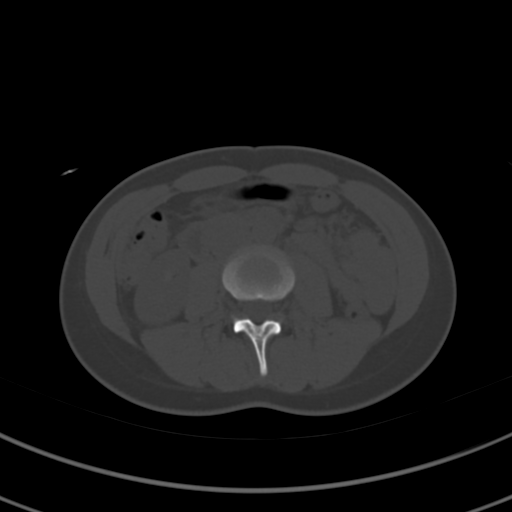
[im 53/82  soft-tissue]
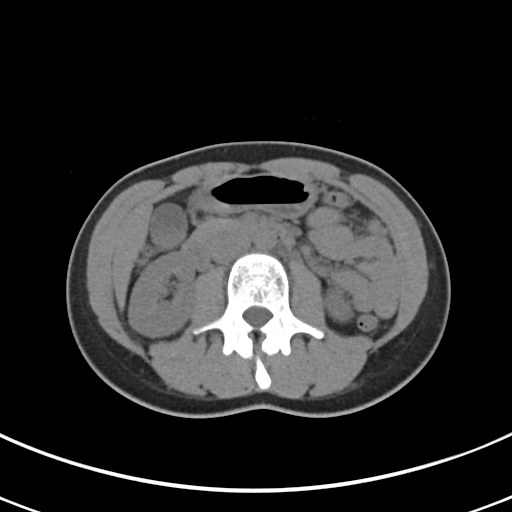
[im 60/82  soft-tissue]
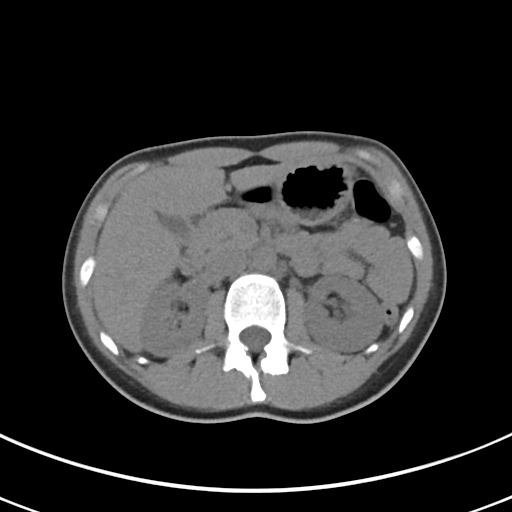
[im 66/82  soft-tissue]
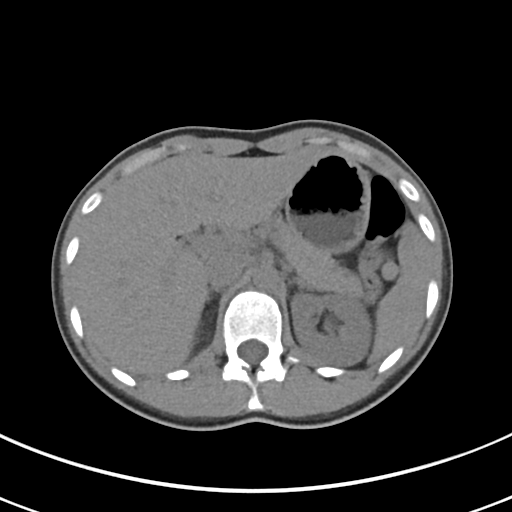
[im 72/82  soft-tissue]
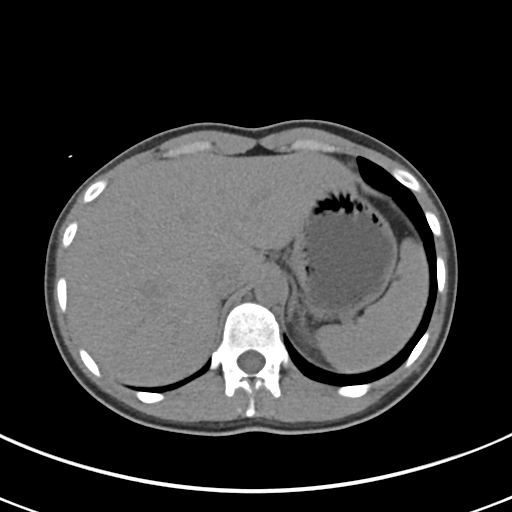
[im 78/82  soft-tissue]
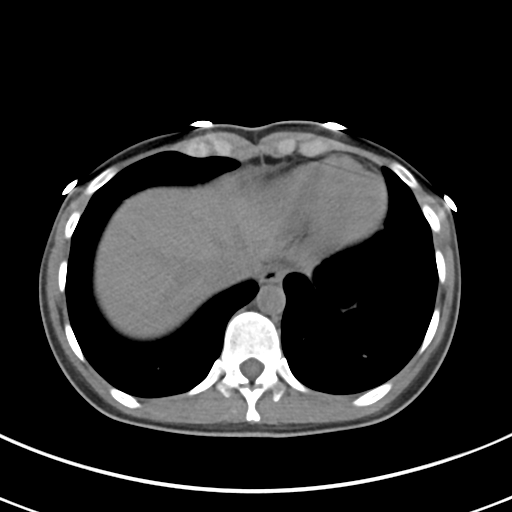

[Series 6: coronal soft tissue · coronal · 0.71mm/px · 3 of 70 slices shown]
[im 24/70  soft-tissue]
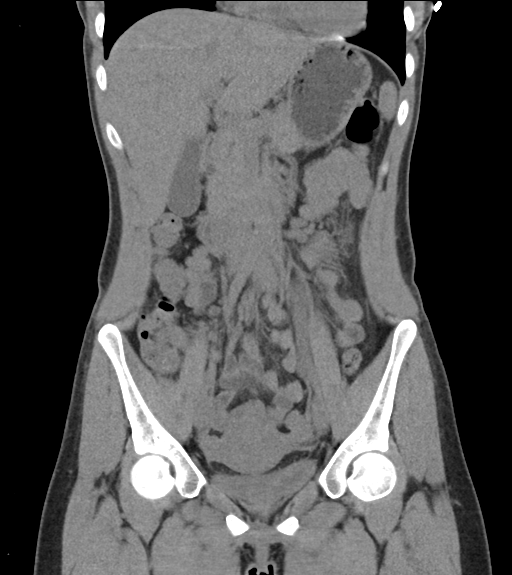
[im 31/70  soft-tissue]
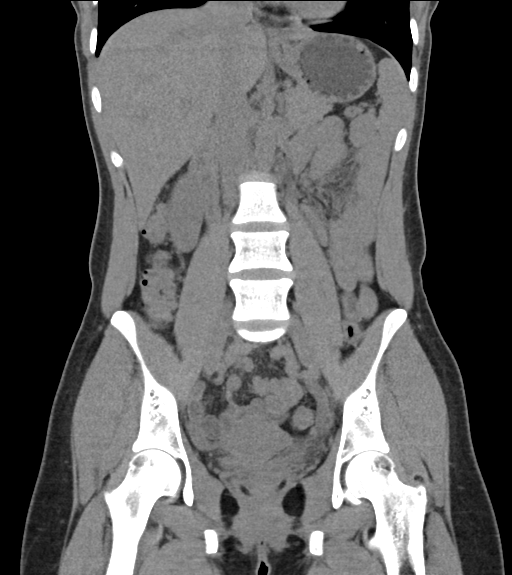
[im 39/70  soft-tissue]
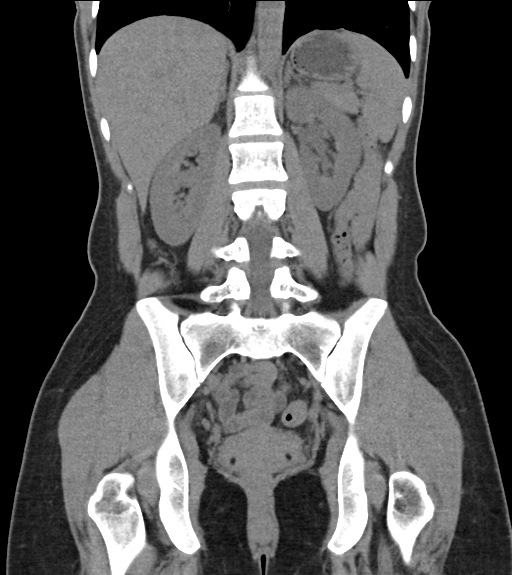

[17 of 46 positions shown; findings below may reference images not displayed]

FINDINGS: Lower chest: Lung bases clear

Hepatobiliary: Gallbladder and liver normal appearance

Pancreas: Normal appearance

Spleen: Normal appearance.

Adrenals/Urinary Tract: Adrenal glands: Normal appearance. RIGHT
kidney normal appearance. Mild LEFT hydronephrosis and hydroureter
secondary to a 2 mm distal LEFT ureteral calculus image 64. Bladder
decompressed. No renal masses.

Stomach/Bowel: Normal appendix. Stomach and bowel loops grossly
normal appearance for technique.

Vascular/Lymphatic: Aorta normal caliber. Few pelvic phleboliths. No
adenopathy.

Reproductive: Diaphragm within vagina. Uterus and adnexa
unremarkable.

Other: Small amount of cul-de-sac free fluid, potentially
physiologic in a young female. No free air. No hernia.

Musculoskeletal: Unremarkable
IMPRESSION: Mild LEFT hydronephrosis and hydroureter secondary to a 2 mm distal
LEFT ureteral calculus.

## 2019-09-18 IMAGING — CR DG CHEST 2V
2 series · 2 of 2 positions shown · non-contrast
Comparison: None.

CLINICAL DATA: Cough and diaphoresis

EXAM:
CHEST - 2 VIEW

[chest pa]
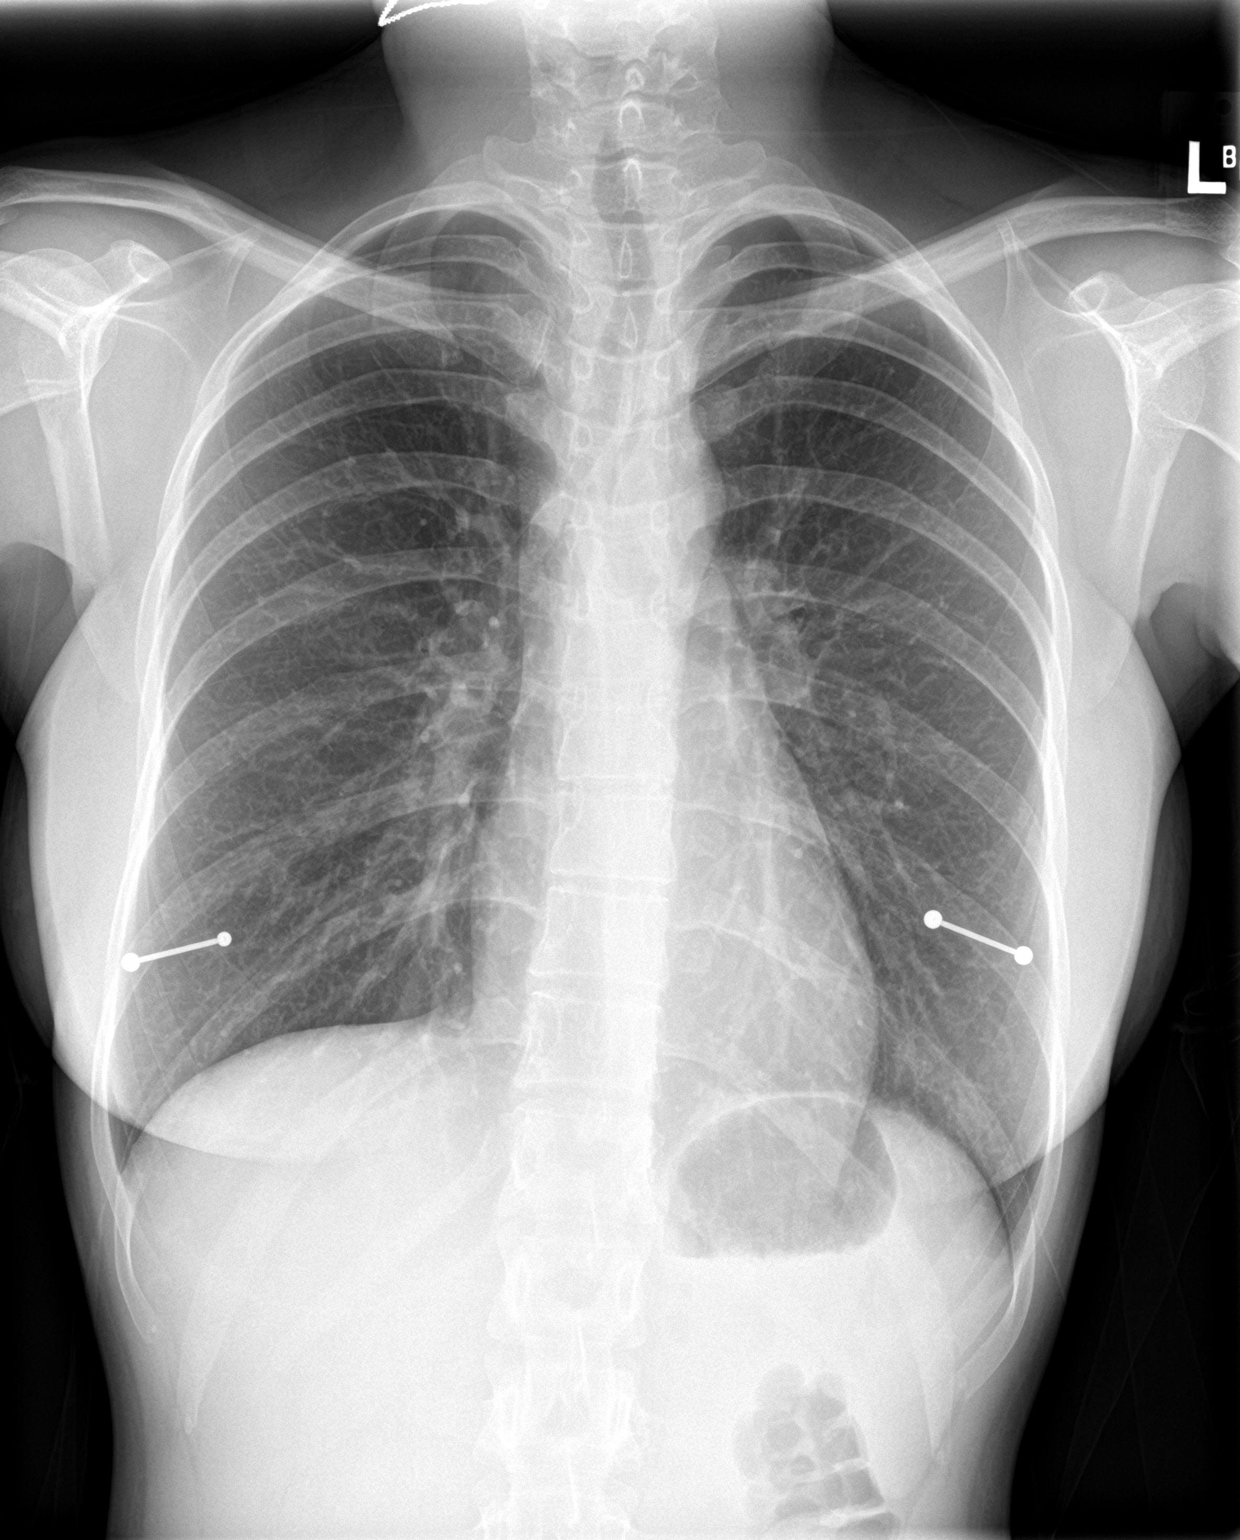

[chest lat]
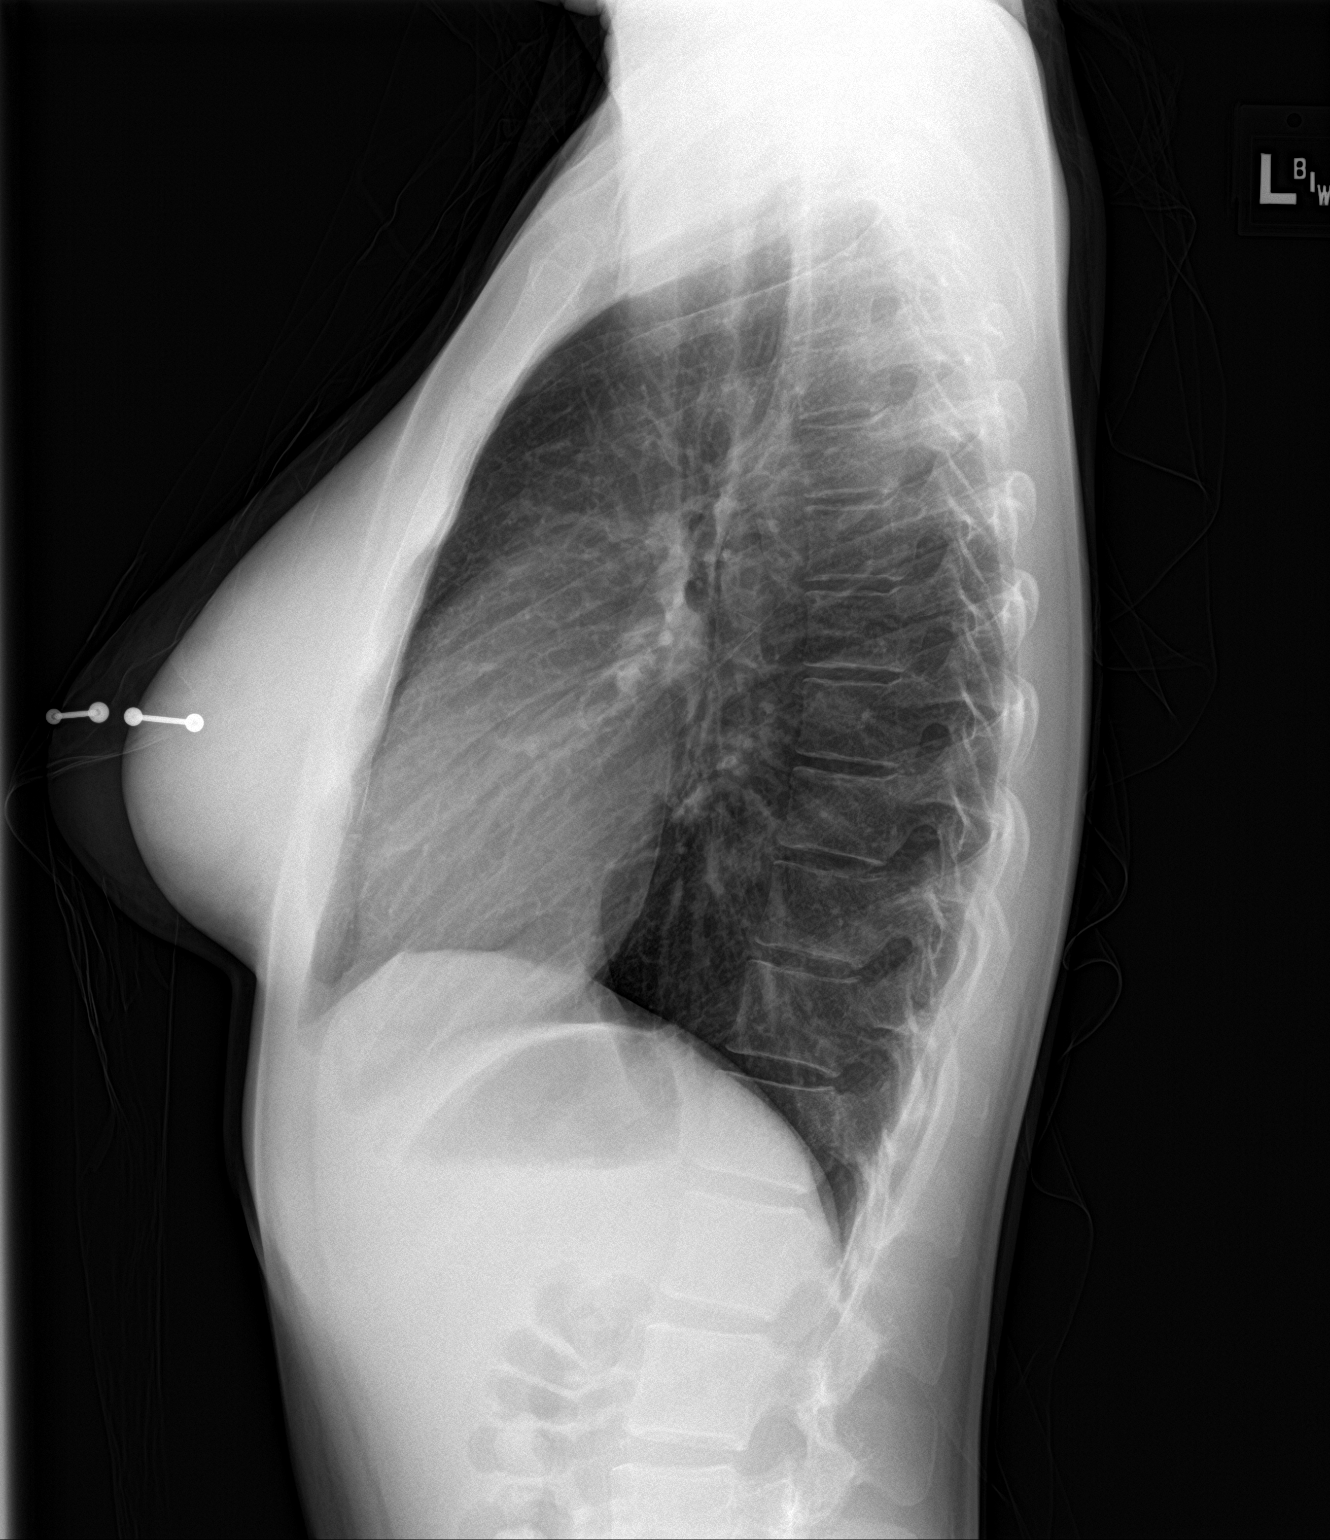

[2 of 2 positions shown; findings below may reference images not displayed]

FINDINGS: The heart size and mediastinal contours are within normal limits.
Both lungs are clear. The visualized skeletal structures are
unremarkable.
IMPRESSION: Normal chest.
